# Patient Record
Sex: Female | Born: 1935 | Race: White | Hispanic: No | Marital: Married
Health system: Southern US, Community
[De-identification: ages and names within clinical notes are randomized; demographics above are authoritative.]

---

## 2008-07-20 ENCOUNTER — Emergency Department (HOSPITAL_BASED_OUTPATIENT_CLINIC_OR_DEPARTMENT_OTHER): Admission: EM | Admit: 2008-07-20 | Discharge: 2008-07-20 | Payer: Self-pay | Admitting: Emergency Medicine

## 2008-07-20 ENCOUNTER — Ambulatory Visit: Payer: Self-pay | Admitting: Diagnostic Radiology

## 2008-08-21 IMAGING — CR DG CHEST 2V
2 series · 2 of 2 positions shown · non-contrast
Comparison: None

CLINICAL DATA: Cervical HNP stenosis.  Preadmission workup.
Asthma.  Hypertension.

CHEST - 2 VIEW

[view not recorded (1 of 2)]
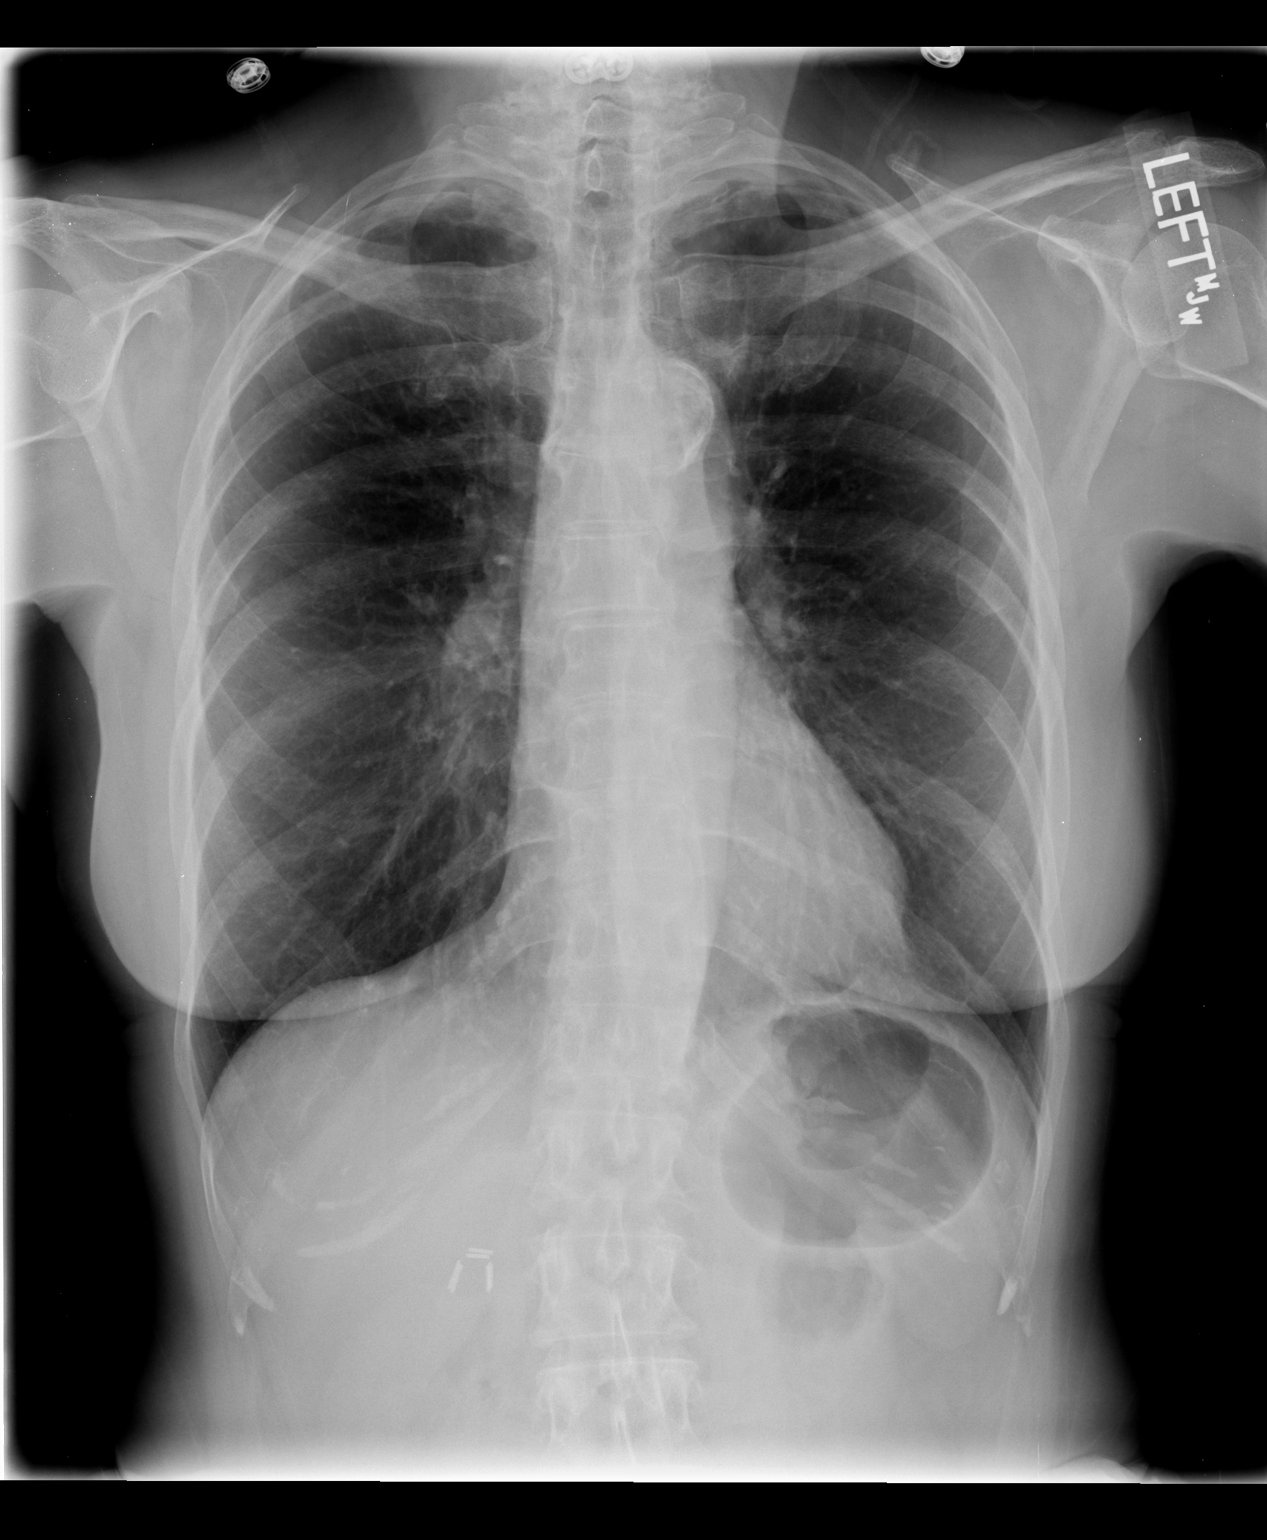

[view not recorded (2 of 2)]
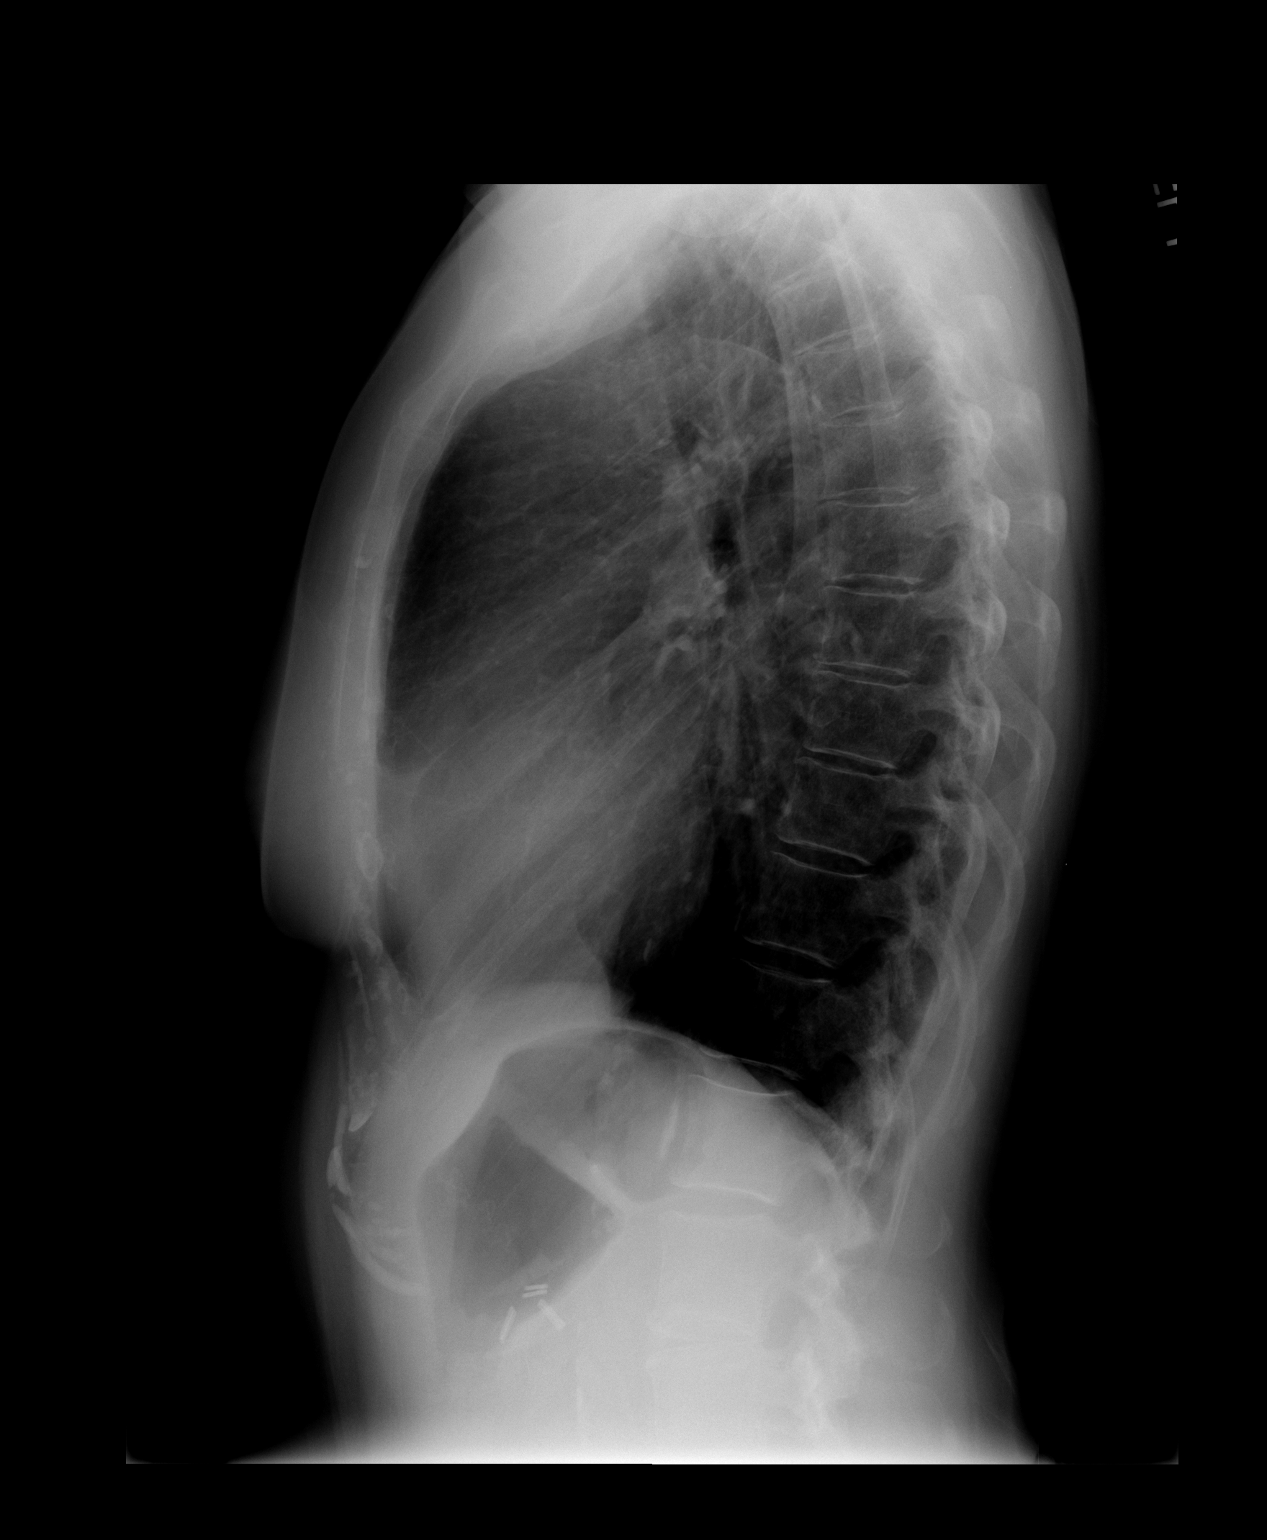

[2 of 2 positions shown; findings below may reference images not displayed]

FINDINGS: The heart size and mediastinal contours are within normal
limits.  Both lungs are moderately hyperaerated and clear.  The
visualized skeletal structures are unremarkable.
IMPRESSION: No active cardiopulmonary disease. Pulmonary hyperaeration.

## 2008-08-22 ENCOUNTER — Inpatient Hospital Stay (HOSPITAL_COMMUNITY): Admission: RE | Admit: 2008-08-22 | Discharge: 2008-08-24 | Payer: Self-pay | Admitting: Neurosurgery

## 2010-09-23 ENCOUNTER — Other Ambulatory Visit: Payer: Self-pay | Admitting: Neurosurgery

## 2010-09-23 DIAGNOSIS — M542 Cervicalgia: Secondary | ICD-10-CM

## 2010-09-29 ENCOUNTER — Ambulatory Visit
Admission: RE | Admit: 2010-09-29 | Discharge: 2010-09-29 | Disposition: A | Discharge status: Home / Self Care | Payer: Medicare Other | Source: Ambulatory Visit | Attending: Neurosurgery | Admitting: Neurosurgery

## 2010-09-29 DIAGNOSIS — M542 Cervicalgia: Secondary | ICD-10-CM

## 2010-11-30 LAB — CBC
HCT: 38.8 % (ref 36.0–46.0)
Platelets: 271 10*3/uL (ref 150–400)
RDW: 13.5 % (ref 11.5–15.5)
WBC: 7.5 10*3/uL (ref 4.0–10.5)

## 2010-11-30 LAB — BASIC METABOLIC PANEL
BUN: 14 mg/dL (ref 6–23)
Calcium: 9.7 mg/dL (ref 8.4–10.5)
Creatinine, Ser: 0.83 mg/dL (ref 0.4–1.2)
GFR calc non Af Amer: >60 mL/min (ref >60)
Glucose, Bld: 94 mg/dL (ref 70–99)
Potassium: 4 mEq/L (ref 3.5–5.1)

## 2010-11-30 LAB — TYPE AND SCREEN
ABO/RH(D): A POS
Antibody Screen: NEGATIVE

## 2010-11-30 LAB — ABO/RH: ABO/RH(D): A POS

## 2010-12-29 ENCOUNTER — Other Ambulatory Visit: Payer: Self-pay | Admitting: Neurosurgery

## 2010-12-29 DIAGNOSIS — M542 Cervicalgia: Secondary | ICD-10-CM

## 2010-12-29 NOTE — Op Note (Signed)
NAME:  Autumn Jones, Autumn Jones               ACCOUNT NO.:  [id number]   MEDICAL RECORD NO.:  [id number]          PATIENT TYPE:  INP   LOCATION:  3110                         FACILITY:  [facility]   PHYSICIAN:  Danae Orleans. Venetia Maxon, M.D.  DATE OF BIRTH:  18-Oct-1935   DATE OF PROCEDURE:  08/22/2008  DATE OF DISCHARGE:                               OPERATIVE REPORT   PREOPERATIVE DIAGNOSES:  Herniated cervical disk with spondylosis,  spondylolisthesis, radiculopathy, and neck pain, C5-6, C6-7, and C7-T1  levels.   POSTOPERATIVE DIAGNOSES:  Herniated cervical disk with spondylosis,  spondylolisthesis, radiculopathy, and neck pain, C5-6, C6-7, and C7-T1  levels.   PROCEDURE:  1. Exploration and fusion C4 through C5 levels with removal of      previous plate.  2. Anterior cervical decompression and fusion, C5-6 and C7-T1 with      allograft bone wedge, EquivaBone, morselized bone autograft, and a      minimal diskectomy at C6-7 with EquivaBone allograft at this level      and anterior cervical plating, C5 through T1 levels.   SURGEON:  Danae Orleans. Venetia Maxon, MD   ASSISTANTS:  1. Stefani Dama, MD  2. Georgiann Cocker, RN   ANESTHESIA:  General endotracheal anesthesia.   ESTIMATED BLOOD LOSS:  50 mL.   COMPLICATIONS:  None.   DISPOSITION:  To recovery.   INDICATIONS:  Autumn Jones is a 75 year old lady who had previously  undergone anterior cervical decompression and fusion, C4-5 at [facility] in 2003.  She has now developed left arm pain with left hand  weakness and has an anterolisthesis of C7 on T1 along with significant  disk degeneration at C6-7 and C5-6 level.  It was elected to take her to  Surgery for exploration of her previous fusion, removal of previous  hardware, and anterior cervical decompression and fusion, C5 through T1  levels.   PROCEDURE:  Ms. Thomassen was brought to the operating room.  Following  satisfactory and uncomplicated induction of general endotracheal  anesthesia and  placement of intravenous lines, the patient was placed in  supine position on the operating table.  Her neck was placed in slight  extension.  She was placed in 5-pound halter traction.  Her anterior  neck was then prepped and draped in usual sterile fashion.  The area of  planned incision was infiltrated with local lidocaine.  An incision was  made on the left side of midline from midline to the anterior border of  sternocleidomastoid muscle and carried sharply through platysma layer.  Subplatysmal dissection was performed exposing the anterior border of  sternocleidomastoid muscle.  Using blunt dissection, the carotid sheath  was kept lateral and trachea and esophagus kept medial, exposing the  anterior cervical spine.  It had been previously noted that her internal  jugular vein had been suture ligated at her previous surgery.  Apart  from some scar tissue, I did not detect any vascular abnormalities.  With careful dissection using Kitner elevators as well as sharp  dissection, I exposed the previous plate which was an EBI anterior  cervical plate and was  able to remove all 4 screws and the plate.  With  careful dissection at the underlying previous fusion, there appeared to  be solid arthrodesis with bridging bone and no evidence of any nonunion  and no evidence of mobility.  Consequently, it was elected to proceed  with further exposure at C5-T1 levels.  The disk spaces were identified  and C5-6 had some bridging bone which was removed revealing a mobile  interspace.  At C6-7, there was some remaining disk material, but there  did not appear to be mobile interspace at this level.  At C7-T1, there  was significant subluxation of C7 on T1.  After localizing x-ray was  obtained with markers at each of these levels, the exposure at C7-T1 was  performed.  Electrocautery was used to remove investing soft tissue  which was then mobilized with Key elevator and self-retaining shadow  line  retractor was placed to facilitate exposure.  The interspace was  incised and disk material was removed in piecemeal fashion.  Microscope  was brought into field.  Distraction pins were placed at C7 and T1 and  we were able to decompress the spinal cord dura and both the C8 nerve  roots extended out the neural foramina.  Care was taken to decompress  more aggressively the C8 nerve root on the left as this was the  symptomatic side.  After trial sizing and shaping, a 9-mm allograft bone  wedge was selected, fashioned with high-speed drill, packed with  EquivaBone and autograft, inserted the interspace, and countersunk  appropriately.  Attention was then turned to the C6-7 levels.  Under  microscopic visualization, it was not possible to mobilize this level.  A minimal diskectomy was performed at this level with curettes and then  the EquivaBone was packed into this bony defect, but it was elected not  perform a thorough diskectomy as the bone appeared to be autofusing.  At  C5-6, distraction pins were placed in C5 and C6 and the interspace was  opened and evacuated of residual disk material.  High-speed drill was  used to decorticate the endplates and spinal cord dura and both C6 nerve  roots were widely decompressed as they extended out the neural foramina.  Hemostasis was assured and after trial sizing, an 8-mm bone allograft  bone wedge was selected, fashioned with high-speed drill, packed with  EquivaBone and autograft, inserted in the interspace, and countersunk  appropriately.  Additional EquivaBone was placed laterally in this  interspace.  A 48-mm Trestle anterior cervical plate was then affixed to  the anterior cervical spine using 12-mm variable angle screws through  fix guide, 2 at C5, 2 at C6, 2 at C7, and 2 at T1.  The left C7 screw  did not have good purchase and was exchanged for a 14-mm rescue screw  which had good purchase.  All screws were locked without difficulty and   the wound was then irrigated. A 7-JP drain was inserted through a  separate stab incision.  Hemostasis appeared to be adequate.  Soft  tissues were inspected and found to be in good repair.  The final x-ray  demonstrated well-positioned interbody grafts and anterior cervical  plate without complicating features.  The platysma layer was closed with  3-0 Vicryl sutures.  Skin edges were reapproximated with 3-0 Vicryl  subcuticular stitch.  The wound was dressed with Benzoin, Steri-Strips,  Telfa gauze, and tape.  The patient was extubated in the operating room  and taken to recovery  in stable and satisfactory condition, having  tolerated the operation well.  Counts were correct at the end of the  case.      Danae Orleans. Venetia Maxon, M.D.  Electronically Signed     JDS/MEDQ  D:  08/22/2008  T:  08/23/2008  Job:  [id number]

## 2010-12-31 ENCOUNTER — Ambulatory Visit
Admission: RE | Admit: 2010-12-31 | Discharge: 2010-12-31 | Disposition: A | Discharge status: Home / Self Care | Payer: Medicare Other | Source: Ambulatory Visit | Attending: Neurosurgery | Admitting: Neurosurgery

## 2010-12-31 DIAGNOSIS — M542 Cervicalgia: Secondary | ICD-10-CM

## 2011-05-21 LAB — POCT CARDIAC MARKERS: CKMB, poc: 2.2 ng/mL (ref 1.0–8.0)

## 2022-01-27 ENCOUNTER — Ambulatory Visit: Payer: Self-pay

## 2022-01-27 ENCOUNTER — Ambulatory Visit: Payer: Medicare HMO | Admitting: Family Medicine

## 2022-01-27 ENCOUNTER — Encounter: Payer: Self-pay | Admitting: Family Medicine

## 2022-01-27 VITALS — BP 120/68 | Ht 60.0 in | Wt 108.0 lb

## 2022-01-27 DIAGNOSIS — M79671 Pain in right foot: Secondary | ICD-10-CM

## 2022-01-27 DIAGNOSIS — L03115 Cellulitis of right lower limb: Secondary | ICD-10-CM | POA: Diagnosis not present

## 2022-01-27 MED ORDER — CEPHALEXIN 250 MG PO CAPS
250.0000 mg | ORAL_CAPSULE | Freq: Four times a day (QID) | ORAL | 0 refills | Status: DC
Start: 2022-01-27 — End: 2022-02-03

## 2022-01-27 NOTE — Progress Notes (Signed)
PCP: Raynelle Jan., MD  Subjective:   HPI: Patient is a 86 y.o. female here for right foot pain.  Patient denies acute injury or trauma. She states about 2 weeks ago she started to notice swelling dorsal right foot. This has worsened over that time and foot has also become red, warm, tender to the touch. She has been using warm compresses, taking tylenol. No fevers, chills, sweats. No history of gout. She does walk 2 miles a day and did so prior to this but no injury. Noticed this the day after biking was when it started but again, no injury.  History reviewed. No pertinent past medical history.  No current outpatient medications on file prior to visit.   No current facility-administered medications on file prior to visit.    History reviewed. No pertinent surgical history.  Allergies  Allergen Reactions   Latex Rash and Other (See Comments)   Tape Rash    BP 120/68   Ht 5' (1.524 m)   Wt 108 lb (49 kg)   BMI 21.09 kg/m       No data to display              No data to display              Objective:  Physical Exam:  Gen: NAD, comfortable in exam room  Right foot/ankle: Mod swelling dorsum of foot with warmth, redness.  No other deformity. FROM ankle without pain.  ROM 1st MTP without pain - mild hallux rigidus. Mild TTP throughout dorsum of foot. Negative ant drawer and Negative talar tilt.   Negative syndesmotic compression. Thompsons test negative. NV intact distally.   Limited MSK u/s right foot:  Notable soft tissue swelling with increased vascularity in superficial soft tissues commonly seen with cellulitis.  No cortical irregularity, edema overlying 2nd-4th metatarsals.  No crystals seen in TMT or 1st MTP joints.  Assessment & Plan:  1. Right foot swelling - with warmth, mild erythema consistent with cellulitis.  Possible early stress fracture but would not expect this level of warmth, erythema and at 2 weeks out would expect ultrasound  findings consistent with this.  Start keflex - renal dosing to max 1g/day (250mg  qid x 7 days).  Icing, elevation.  Tylenol, voltaren gel.  F/u in 1 week.  Discussed red flags to warrant calling sooner.

## 2022-01-27 NOTE — Patient Instructions (Addendum)
You have cellulitis (an infection of the skin) of your foot. Take the antibiotic completely until it's gone - keflex 4 times a day x 7 days. Call me if you have any problems with this medicine. Icing 15 minutes at a time 3-4 times a day - try to avoid heat as it will swell more with this. Elevate above the level of your heart when possible. Topical voltaren gel, tylenol as needed for pain. Follow up with me in 1 week.

## 2022-02-03 ENCOUNTER — Encounter: Payer: Self-pay | Admitting: Family Medicine

## 2022-02-03 ENCOUNTER — Ambulatory Visit: Payer: Medicare HMO | Admitting: Family Medicine

## 2022-02-03 VITALS — BP 141/84 | Ht 60.0 in | Wt 108.0 lb

## 2022-02-03 DIAGNOSIS — M79671 Pain in right foot: Secondary | ICD-10-CM | POA: Diagnosis not present

## 2022-02-03 DIAGNOSIS — L03115 Cellulitis of right lower limb: Secondary | ICD-10-CM | POA: Diagnosis not present

## 2022-02-03 MED ORDER — CEPHALEXIN 250 MG PO CAPS: 250.0000 mg | ORAL_CAPSULE | Freq: Four times a day (QID) | ORAL | 0 refills | Status: AC

## 2022-02-03 NOTE — Patient Instructions (Signed)
Take the antibiotic for 5 more days until this is gone. Ice 15 minutes at a time as needed. Elevate your foot above the level of your heart when possible. Compression stockings, TED hose may help with the swelling too but are difficult to get on. After a week start the exercises for your ankle (peroneal tendinopathy). Follow up with me in 2 weeks but call me sooner if you have any issues.

## 2022-02-03 NOTE — Progress Notes (Signed)
PCP: Raynelle Jan., MD  Subjective:   HPI: Patient is a 86 y.o. female here for right foot pain.  6/14: Patient denies acute injury or trauma. She states about 2 weeks ago she started to notice swelling dorsal right foot. This has worsened over that time and foot has also become red, warm, tender to the touch. She has been using warm compresses, taking tylenol. No fevers, chills, sweats. No history of gout. She does walk 2 miles a day and did so prior to this but no injury. Noticed this the day after biking was when it started but again, no injury.  6/21: Patient reports she's doing better. Has been taking keflex as prescribed. Swelling, redness has improved. Swelling is worse if on her feet and difficult to put shoes on without discomfort. Taking tylenol ES at nighttime. No fever, chills, sweats.  History reviewed. No pertinent past medical history.  No current outpatient medications on file prior to visit.   No current facility-administered medications on file prior to visit.    History reviewed. No pertinent surgical history.  Allergies  Allergen Reactions   Latex Rash and Other (See Comments)   Tape Rash    BP (!) 141/84   Ht 5' (1.524 m)   Wt 108 lb (49 kg)   BMI 21.09 kg/m       No data to display              No data to display              Objective:  Physical Exam:  Gen: NAD, comfortable in exam room  Right foot/ankle: Minimal swelling dorsal foot, much improved.  Minimal redness distally.  No warmth, ecchymoses. FROM ankle without pain. Mild TTP dorsal foot over 3rd MT area.  Minimal tenderness peroneal tendons. NV intact distally.  Limited MSK u/s right foot: 3rd metatarsal appears normal. Neovascularity greatly diminished.    Assessment & Plan:  1. Right foot cellulitis - clinically improving.  Will continue keflex for 5 more days.  Icing, elevation, compression.  She had some pain over peroneal tendons - given home exercises  for this to start in a week.  F/u in 2 weeks.

## 2022-02-17 ENCOUNTER — Ambulatory Visit
Admission: RE | Admit: 2022-02-17 | Discharge: 2022-02-17 | Disposition: A | Discharge status: Home / Self Care | Payer: Medicare HMO | Source: Ambulatory Visit | Attending: Family Medicine | Admitting: Family Medicine

## 2022-02-17 ENCOUNTER — Ambulatory Visit: Payer: Medicare HMO | Admitting: Family Medicine

## 2022-02-17 ENCOUNTER — Encounter: Payer: Self-pay | Admitting: Family Medicine

## 2022-02-17 VITALS — BP 126/62 | Ht 60.0 in | Wt 108.0 lb

## 2022-02-17 DIAGNOSIS — M79671 Pain in right foot: Secondary | ICD-10-CM

## 2022-02-17 NOTE — Progress Notes (Signed)
PCP: Raynelle Jan., MD  Subjective:   HPI: Patient is a 86 y.o. female here for right foot pain.  6/14: Patient denies acute injury or trauma. She states about 2 weeks ago she started to notice swelling dorsal right foot. This has worsened over that time and foot has also become red, warm, tender to the touch. She has been using warm compresses, taking tylenol. No fevers, chills, sweats. No history of gout. She does walk 2 miles a day and did so prior to this but no injury. Noticed this the day after biking was when it started but again, no injury.  6/21: Patient reports she's doing better. Has been taking keflex as prescribed. Swelling, redness has improved. Swelling is worse if on her feet and difficult to put shoes on without discomfort. Taking tylenol ES at nighttime. No fever, chills, sweats.  7/5: Patient reports she has improved but not completely. Still with warmth, redness, swelling dorsal right foot and pain with ambulation. Completed her antibiotics. Pain is a throbbing worse at times.  History reviewed. No pertinent past medical history.  Current Outpatient Medications on File Prior to Visit  Medication Sig Dispense Refill   cephALEXin (KEFLEX) 250 MG capsule Take 1 capsule (250 mg total) by mouth 4 (four) times daily. 20 capsule 0   No current facility-administered medications on file prior to visit.    History reviewed. No pertinent surgical history.  Allergies  Allergen Reactions   Latex Rash and Other (See Comments)   Tape Rash    BP 126/62   Ht 5' (1.524 m)   Wt 108 lb (49 kg)   BMI 21.09 kg/m       No data to display              No data to display              Objective:  Physical Exam:  Right foot: Mild warmth, redness, swelling dorsal foot.  No bruising, other deformity. FROM with 5/5 strength without pain. Tenderness to palpation over 2nd-4th metatarsals. NVI distally.  Limited MSK u/s right foot: 3rd metatarsal  with bony irregularity and neovascularity distally.  Assessment & Plan:  1. Right foot pain - patient over 5 weeks out now from start of this issue.  She was treated for cellulitis with 14 days of keflex with incomplete relief.  Swelling, redness, warmth improved but not completely.  No injury.  Discussed possibility with today's ultrasound of this being an atypical stress fracture with incomplete healing to date (would not expect level of redness, warmth that she has had though) vs osteomyelitis.  Will go ahead with MRI to further assess.

## 2022-02-17 NOTE — Patient Instructions (Signed)
We will go ahead with an MRI of your foot to assess for infection into the bone. I suspect this is just an atypical healing stress fracture of your third metatarsal though. Walking as tolerated. Wear comfortable shoes. Icing, elevation. If you notice the redness or pain is significantly worsening, you get a fever > 100.24F, call me or go to the emergency department (this is unlikely). Follow up will depend on the MRI results - I'll call you with these.

## 2022-03-10 ENCOUNTER — Ambulatory Visit: Payer: Medicare HMO | Admitting: Family Medicine

## 2022-03-10 VITALS — BP 141/56 | Ht 60.0 in | Wt 108.0 lb

## 2022-03-10 DIAGNOSIS — M84376D Stress fracture, unspecified foot, subsequent encounter for fracture with routine healing: Secondary | ICD-10-CM | POA: Insufficient documentation

## 2022-03-10 DIAGNOSIS — M84374D Stress fracture, right foot, subsequent encounter for fracture with routine healing: Secondary | ICD-10-CM

## 2022-03-10 NOTE — Progress Notes (Unsigned)
   Established Patient Office Visit  Subjective   Patient ID: Autumn Jones, female    DOB: 08/07/1936  Age: 86 y.o. MRN: [id number]  Follow up R foot pain.  Patient is a 86 y.o. female here for right foot pain.   6/14: Patient denies acute injury or trauma. She states about 2 weeks ago she started to notice swelling dorsal right foot. This has worsened over that time and foot has also become red, warm, tender to the touch. She has been using warm compresses, taking tylenol. No fevers, chills, sweats. No history of gout. She does walk 2 miles a day and did so prior to this but no injury. Noticed this the day after biking was when it started but again, no injury.   6/21: Patient reports she's doing better. Has been taking keflex as prescribed. Swelling, redness has improved. Swelling is worse if on her feet and difficult to put shoes on without discomfort. Taking tylenol ES at nighttime. No fever, chills, sweats.   7/5: Patient reports she has improved but not completely. Still with warmth, redness, swelling dorsal right foot and pain with ambulation. Completed her antibiotics. Pain is a throbbing worse at times.  7/27: Patient states she has no pain throughout the day doing her normal activities.  She has not yet returned to walking for exercise.  She does enjoy walking approximately 2 miles on the treadmill.  She has some intermittent pain at nighttime but it does not keep her awake.  Overall she is improving.    Objective:     BP (!) 141/56   Ht 5' (1.524 m)   Wt 108 lb (49 kg)   BMI 21.09 kg/m    Physical Exam Vitals reviewed.  Constitutional:      General: She is not in acute distress.    Appearance: Normal appearance. She is normal weight. She is not ill-appearing, toxic-appearing or diaphoretic.  Neurological:     Mental Status: She is alert.   Right foot: No obvious deformity, asymmetry, erythema ecchymosis, or swelling.  Very mild tenderness to palpation  over her third metatarsal.  Full range of motion plantarflexion and dorsiflexion eversion and inversion without pain.  Strength 5/5 dorsiflexion plantarflexion and eversion and inversion.  Distal pulses 2+.  Evidence on ultrasound of bony callus present on the third metatarsal, which also corresponds with to the area of her tenderness.    Assessment & Plan:   Problem List Items Addressed This Visit       Musculoskeletal and Integument   Metatarsal stress fracture with routine healing - Primary    Third metatarsal stress fracture occurring likely 01/2022. Ultrasound evidence of bony callus.  Patient has no pain with walking around for activities of daily living. Injury is 6 weeks past, recommend to patient today that she may restart her walking regimen for exercise in a hard soled shoe. Would recommend beginning a quarter of a mile today, she may increase by a quarter of a mile each day that she has no pain.  I anticipate she will do very well from this injury.       Return if symptoms worsen or fail to improve.    Claudie Leach, DO

## 2022-03-10 NOTE — Assessment & Plan Note (Signed)
Third metatarsal stress fracture occurring likely 01/2022. Ultrasound evidence of bony callus.  Patient has no pain with walking around for activities of daily living. Injury is 6 weeks past, recommend to patient today that she may restart her walking regimen for exercise in a hard soled shoe. Would recommend beginning a quarter of a mile today, she may increase by a quarter of a mile each day that she has no pain.  I anticipate she will do very well from this injury.

## 2022-03-10 NOTE — Patient Instructions (Signed)
You're doing great! Start walking on treadmill - start at 1/4th mile each day, can increase by 1/4 mile increments if you're doing well and not having increased pain. Follow up with me as needed

## 2022-03-11 ENCOUNTER — Encounter: Payer: Self-pay | Admitting: Family Medicine

## 2022-05-10 ENCOUNTER — Ambulatory Visit: Payer: Medicare HMO | Admitting: Family Medicine

## 2022-05-10 VITALS — BP 94/52 | Ht 60.0 in | Wt 108.0 lb

## 2022-05-10 DIAGNOSIS — M545 Low back pain, unspecified: Secondary | ICD-10-CM

## 2022-05-10 NOTE — Patient Instructions (Signed)
You have strained your latissimus muscle of your back. Heat 15 minutes at a time 3-4 times a day. Tylenol as you have been for pain. Voltaren gel topically up to 4 times a day as needed. Do home exercises/stretches daily until the pain resolves.

## 2022-05-10 NOTE — Progress Notes (Unsigned)
  Autumn Jones - 86 y.o. female MRN [id number]  Date of birth: 10-Jul-1936    CHIEF COMPLAINT:       SUBJECTIVE:   HPI:  Started having pain in lower back, up to rib, located on the right side. Pain was so bad, she couldn't take a deep breath. Sometimes her back would catch while she was walking. This all started about 3 weeks ago, as she was preparing for the beach, by walking on her treadmill. Was having problems stepping and breathing through pain. Describes the pain as sharp and only happens when she is moving. Doesn't appreciate any weakness or balance issues. No issues with using the bathroom, and normal bowel habits. Had lower back issues about 6 years ago.   ROS:     See HPI  PERTINENT  PMH / PSH FH / / SH:  Past Medical, Surgical, Social, and Family History Reviewed & Updated in the EMR.  Pertinent findings include:    OBJECTIVE: BP (!) 94/52   Ht 5' (1.524 m)   Wt 108 lb (49 kg)   BMI 21.09 kg/m   Physical Exam:  Vital signs are reviewed.  GEN: Alert and oriented, NAD Pulm: Breathing unlabored PSY: normal mood, congruent affect  MSK: Back: no deformity, ecchymosis, or erythema. Right sided TTP over right lumbar paraspinal muscles, no TTP up spine, good rotational ROM laterally w/o pain, and no pain with flexion/extension of back Rt Hip: FROM about hip w/o pain, no TTP. Good gait, no balance irregularities. Negative straight leg raise  ASSESSMENT & PLAN:  1. Lumbar Strain Patient likely suffering from lumbar muscle strain, given hx and exam. Patient has no findings concerning for fracture or misalignment. Patient continues to have normal bathroom habits, no concern for cauda equina syndrome. Patient may also be suffering from OA, but less likely given the TTP over the lumbar muscles. Will recommend conservative measures. -Voltaren Gel -Tylenol PRN -Home exercises   Holley Bouche, MD PGY-2, [facility] Resident [place]

## 2022-05-11 ENCOUNTER — Encounter: Payer: Self-pay | Admitting: Family Medicine

## 2022-12-22 ENCOUNTER — Ambulatory Visit: Payer: Medicare HMO | Admitting: Family Medicine

## 2022-12-27 ENCOUNTER — Ambulatory Visit: Payer: Medicare HMO | Admitting: Family Medicine

## 2022-12-27 ENCOUNTER — Other Ambulatory Visit: Payer: Self-pay

## 2022-12-27 ENCOUNTER — Encounter: Payer: Self-pay | Admitting: Family Medicine

## 2022-12-27 VITALS — BP 108/62 | Ht 60.0 in | Wt 107.0 lb

## 2022-12-27 DIAGNOSIS — M79644 Pain in right finger(s): Secondary | ICD-10-CM

## 2022-12-27 NOTE — Patient Instructions (Signed)
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 Voltaren gel up to four times a day may also help with pain. Consider Boswellia extract - this is the supplement you can ask at [facility] or another supplement store Try splinting some to rest this for the next 1-2 weeks to see if this calms it down. Cortisone injections are an option if your pain is severe. Ice 15 minutes at a time 3-4 times a day as needed to help with pain. Follow up with me in 1 month or as needed.

## 2022-12-27 NOTE — Progress Notes (Signed)
PCP: Raynelle Jan., MD  Subjective:   HPI: Patient is a 87 y.o. female here for right ring finger pain.  Patient denies acute injury or trauma. She's noticed swelling and pain mainly around her 4th PIP joint for several weeks now. Feels like this finger catches though motion very limited. Difficulty making a fist. No bruising.  History reviewed. No pertinent past medical history.  Current Outpatient Medications on File Prior to Visit  Medication Sig Dispense Refill   predniSONE (DELTASONE) 20 MG tablet Take by mouth.     cephALEXin (KEFLEX) 250 MG capsule Take 1 capsule (250 mg total) by mouth 4 (four) times daily. (Patient not taking: Reported on 12/27/2022) 20 capsule 0   No current facility-administered medications on file prior to visit.    History reviewed. No pertinent surgical history.  Allergies  Allergen Reactions   Latex Rash and Other (See Comments)   Tape Rash    BP 108/62   Ht 5' (1.524 m)   Wt 107 lb (48.5 kg)   BMI 20.90 kg/m       No data to display              No data to display              Objective:  Physical Exam:  Gen: NAD, comfortable in exam room  Right 4th digit: Obvious swelling circumferentially about 4th PIP.  No other swelling.  No bruising, malrotation or angulation.  Heberdens and Bouchards nodes seen other digits. TTP around PIP joint.  No other tenderness. Full extension but flexion limited to 60 degrees at 4th PIP.  Strength 5/5 flexion and extension. Collateral ligaments intact. NVI distally.  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  Flexor and extensor tendons intact.  PIP effusion.  Assessment & Plan:  1. Right 4th finger pain - 2/2 arthritis at PIP joint.  Tylenol, topical voltaren gel, icing.  Consider boswellia extract.  Splinting to help rest this.  Consider injection if not improving or pain is severe as next step.  F/u in 1 month or prn.

## 2023-01-31 ENCOUNTER — Ambulatory Visit: Payer: Medicare HMO | Admitting: Family Medicine

## 2024-02-22 ENCOUNTER — Encounter: Payer: Self-pay | Admitting: Family Medicine

## 2024-02-22 ENCOUNTER — Other Ambulatory Visit: Payer: Self-pay

## 2024-02-22 ENCOUNTER — Ambulatory Visit (INDEPENDENT_AMBULATORY_CARE_PROVIDER_SITE_OTHER): Admitting: Family Medicine

## 2024-02-22 VITALS — BP 136/62 | Ht 60.0 in | Wt 108.0 lb

## 2024-02-22 DIAGNOSIS — M79602 Pain in left arm: Secondary | ICD-10-CM

## 2024-02-22 DIAGNOSIS — G5602 Carpal tunnel syndrome, left upper limb: Secondary | ICD-10-CM

## 2024-02-22 NOTE — Progress Notes (Addendum)
 PCP: Ryan Powell HERO., MD  Subjective:   HPI: Patient is a 88 y.o. female here for left hand numbness that has been present for the past few months.  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  She was given a brace for this which she purchased from [employer] and used and the numbness along her medial wrist resolved.  She then noticed that her ventral wrist started to have some pain with tingling sensations extending out to her left thumb, index finger, middle finger.  She denies any numbness or tingling in her pinky finger.  She denies any previous injuries, surgeries, or trauma in this wrist.  No past medical history on file.  Current Outpatient Medications on File Prior to Visit  Medication Sig Dispense Refill   cephALEXin  (KEFLEX ) 250 MG capsule Take 1 capsule (250 mg total) by mouth 4 (four) times daily. (Patient not taking: Reported on 12/27/2022) 20 capsule 0   No current facility-administered medications on file prior to visit.    No past surgical history on file.  Allergies  Allergen Reactions   Latex Rash and Other (See Comments)   Tape Rash    BP 136/62   Ht 5' (1.524 m)   Wt 108 lb (49 kg)   BMI 21.09 kg/m       No data to display              No data to display              Objective:  Physical Exam:  Gen: NAD, comfortable in exam room  Left hand/wrist: Hands are warm and well-perfused Multiple joints of the hands with arthritic changes noted Pulses intact bilaterally, normal capillary refill No tenderness including carpal tunnel, cubital tunnel, guyons canal. Range of motion equal with both wrist flexion and extension bilaterally Strength is equal bilaterally Positive Phalen on the left Negative cubital tunnel No thenar atrophy  Left wrist ultrasound SAX view of the left median nerve was  observed Area of median nerve circumferentially was 0.07 cm2 No other abnormalities noted during ultrasound   Assessment & Plan:  1.  Left carpal tunnel syndrome - mild - Provided left cock up wrist splint for nighttime and recommended use during the day if possible -Continue Tylenol as needed -May pursue steroid injection in the future -We also obtain nerve conduction studies to assess severity -Follow-up in 6 weeks

## 2024-02-22 NOTE — Patient Instructions (Signed)
 You have carpal tunnel syndrome. Wear the wrist brace at nighttime and as often as possible during the day Tylenol as needed. A prednisone dose pack is a consideration. Steroid injection is a consideration to help with pain and inflammation. If not improving, will consider nerve conduction studies to assess severity. Follow up with me in 6 weeks.

## 2024-03-12 ENCOUNTER — Telehealth: Payer: Self-pay

## 2024-03-12 MED ORDER — PREDNISONE 10 MG PO TABS: ORAL_TABLET | ORAL | 0 refills | Status: AC

## 2024-03-12 NOTE — Telephone Encounter (Signed)
 Per. Dr Cleatrice - will try a 6 day course of prednisone , continue bracing. NCS/EMG testing is an option but usually it's not accurate within 6 weeks of symptoms starting. Will start with prednisone  and if she continues to have problems we'll consider ordering EMG at that time.

## 2024-04-04 ENCOUNTER — Ambulatory Visit: Admitting: Family Medicine

## 2024-04-04 VITALS — BP 134/62 | Ht 60.0 in | Wt 106.0 lb

## 2024-04-04 DIAGNOSIS — G5602 Carpal tunnel syndrome, left upper limb: Secondary | ICD-10-CM | POA: Diagnosis not present

## 2024-04-04 DIAGNOSIS — M79602 Pain in left arm: Secondary | ICD-10-CM

## 2024-04-04 NOTE — Progress Notes (Unsigned)
 PCP: Fairy Lyndee Jackson, FNP  Subjective:   HPI: Patient is a 88 y.o. female here for follow-up evaluation of left wrist pain, she was last seen in clinic on 02/22/2024.  Patient reports that her left wrist pain/carpal tunnel syndrome has improved since her previous visit. She has been wearing her wrist brace nightly and finds it to be very helpful. She says she still gets the occasional tingling sensation in her thumb, pointer, and middle finger but that they no longer feel numb. She reports that the 6 day course of steroids was also helpful. She has been taking Tylenol as needed. Of note, she has pain in the ulnar aspect of her wrist that she states began sometime around April/May when she was doing physical therapy for her shoulder. She says this pain has remained unchanged for several months.   No past medical history on file.  Current Outpatient Medications on File Prior to Visit  Medication Sig Dispense Refill   cephALEXin  (KEFLEX ) 250 MG capsule Take 1 capsule (250 mg total) by mouth 4 (four) times daily. (Patient not taking: Reported on 12/27/2022) 20 capsule 0   predniSONE  (DELTASONE ) 10 MG tablet 6 tabs by mouth day 1, 5 tabs by mouth day 2, 4 tabs by mouth day 3, 3 tabs by mouth day 4, 2 tabs by mouth day 5, 1 tab by mouth day 6 21 tablet 0   No current facility-administered medications on file prior to visit.    No past surgical history on file.  Allergies  Allergen Reactions   Latex Rash and Other (See Comments)   Tape Rash    BP 134/62   Ht 5' (1.524 m)   Wt 106 lb (48.1 kg)   BMI 20.70 kg/m       No data to display              No data to display              Objective:  Physical Exam:  Gen: NAD, comfortable in exam room  MSK: Left wrist Inspection: No bony or soft tissue abnormalities appreciated. Appears symmetrical in appearance compared with right wrist.  Palpation: There is tenderness to palpation over the transverse carpal ligament and TFCC.  No tenderness to palpation of the distal radius or ulna.  ROM: FROM with flexion/extension and eversion/inversion Strength: 5/5 with flexion/extension and eversion/inversion Neuro/Vasc: Neurovascularly intact distally Special Tests: Phalen and Tinel sign positive, negative Finkelstein's    Assessment & Plan:  1.  Left carpal tunnel syndrome - mild - Patient's carpal tunnel syndrome has improved with the use of wrist bracing at night and a short course of steroids. Discussed with patient that she can continue to wear the wrist brace for an additional 6 weeks and can continue to use Tylenol and Voltaren as needed. Follow-up as needed.   - Continue wrist brace for an additional 6 weeks  - Voltaren and Tylenol as needed  - Follow-up as needed   2. 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 Discussed with patient that the cartilage should heal with time and that her pain should progressively improve. Advised her to continue wearing the wrist brace, even during the day for added support and to prevent further overuse/injury of the cartilage.   - Continue wrist brace for 6 weeks  - Follow-up as needed  Signe Ravel, MS4 [facility]

## 2024-04-05 ENCOUNTER — Encounter: Payer: Self-pay | Admitting: Family Medicine
